# Patient Record
Sex: Male | Born: 1970 | Race: White | Hispanic: No | Marital: Married | Smoking: Never smoker
Health system: Southern US, Community
[De-identification: ages and names within clinical notes are randomized; demographics above are authoritative.]

## PROBLEM LIST (undated history)

## (undated) DIAGNOSIS — J302 Other seasonal allergic rhinitis: Secondary | ICD-10-CM

---

## 2008-11-07 ENCOUNTER — Emergency Department (HOSPITAL_BASED_OUTPATIENT_CLINIC_OR_DEPARTMENT_OTHER): Admission: EM | Admit: 2008-11-07 | Discharge: 2008-11-07 | Payer: Self-pay | Admitting: Emergency Medicine

## 2008-11-07 ENCOUNTER — Ambulatory Visit: Payer: Self-pay | Admitting: Diagnostic Radiology

## 2010-05-11 LAB — URINALYSIS, ROUTINE W REFLEX MICROSCOPIC
Glucose, UA: NEGATIVE mg/dL
Hgb urine dipstick: NEGATIVE
Nitrite: NEGATIVE
Specific Gravity, Urine: 1.017 (ref 1.005–1.030)
Urobilinogen, UA: 1 mg/dL (ref 0.0–1.0)

## 2010-11-17 IMAGING — CR DG CHEST 2V
2 series · 2 of 2 positions shown · non-contrast
Comparison: None

CLINICAL DATA: Left chest and left upper quadrant pain, worsened
with inspiration

CHEST - 2 VIEW

[w chest pa]
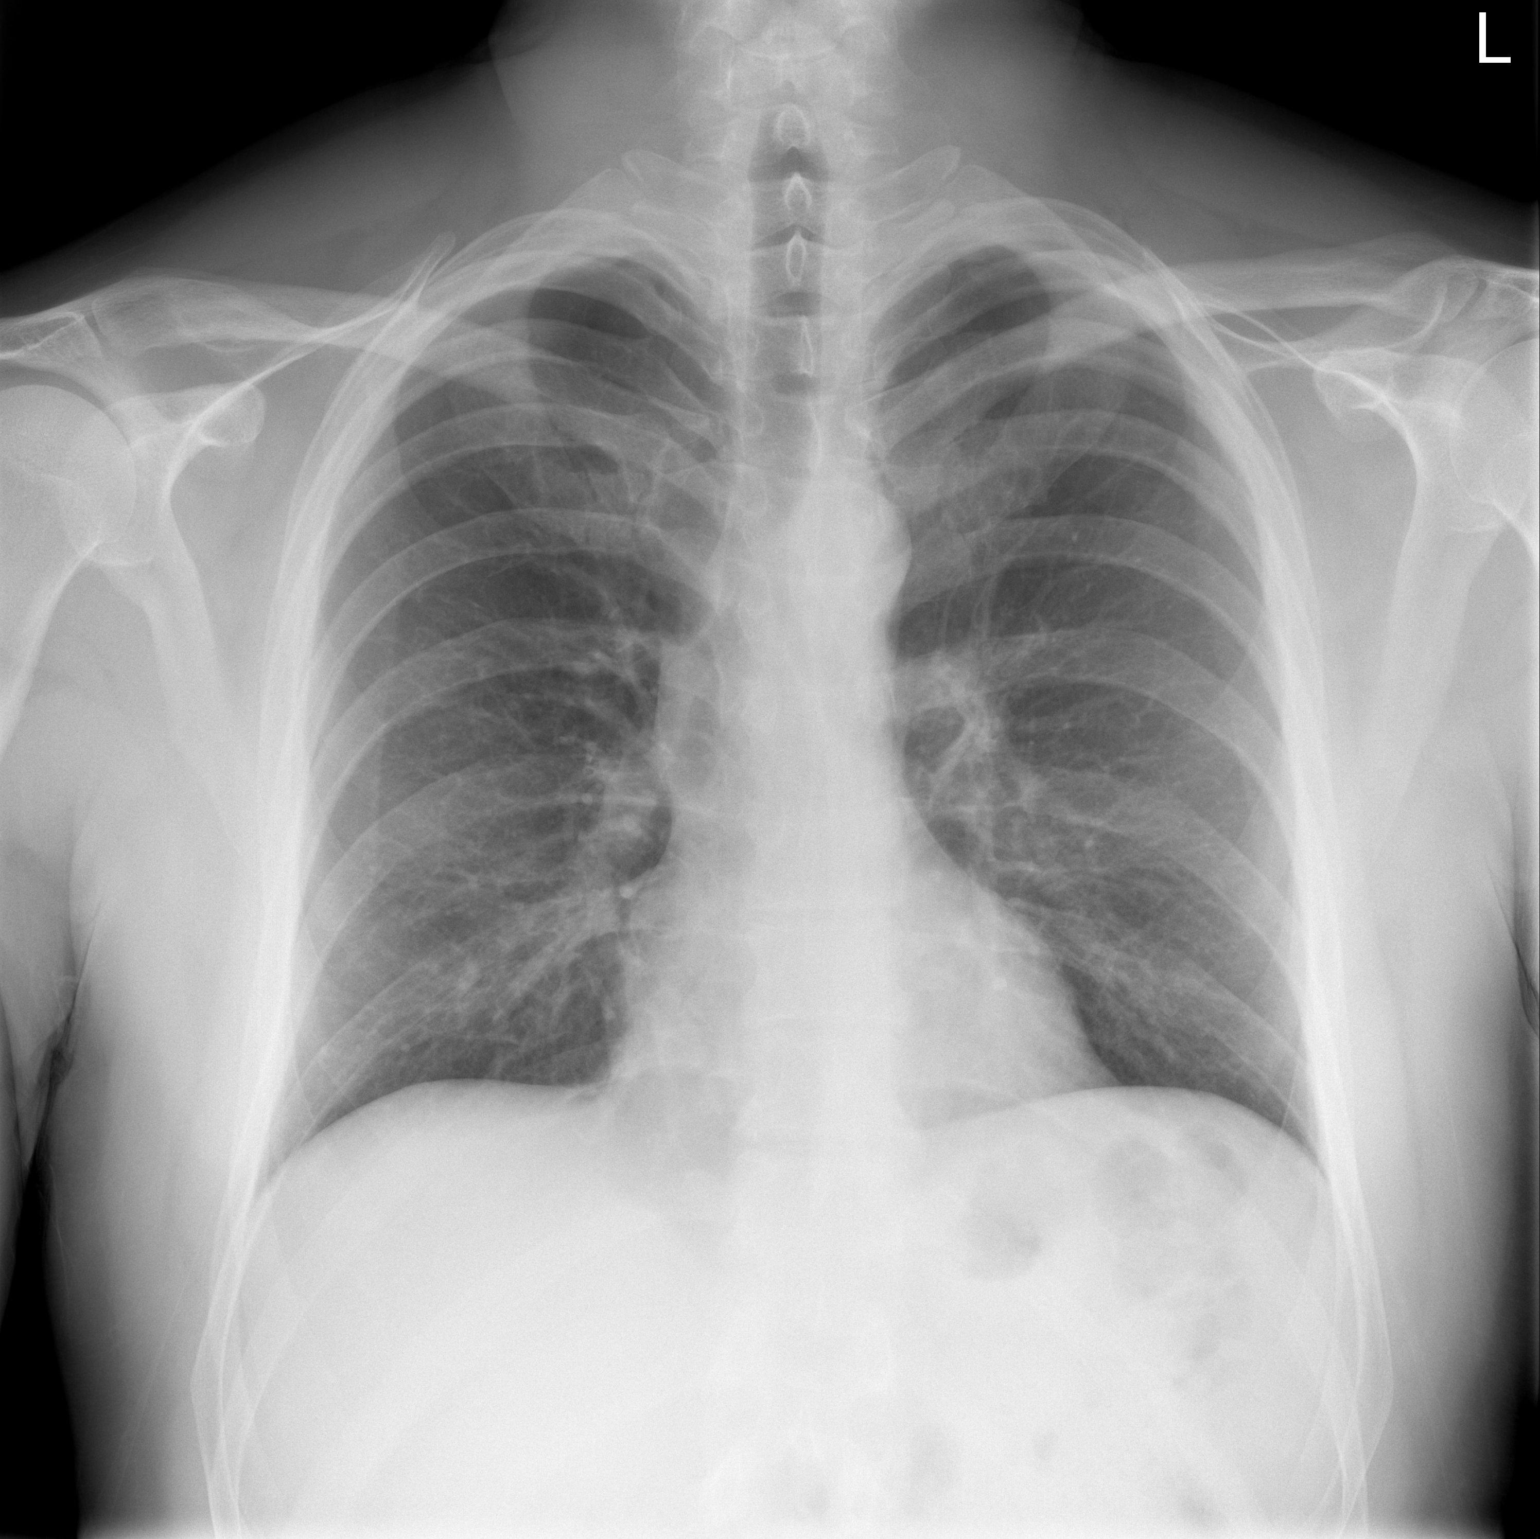

[w chest lat]
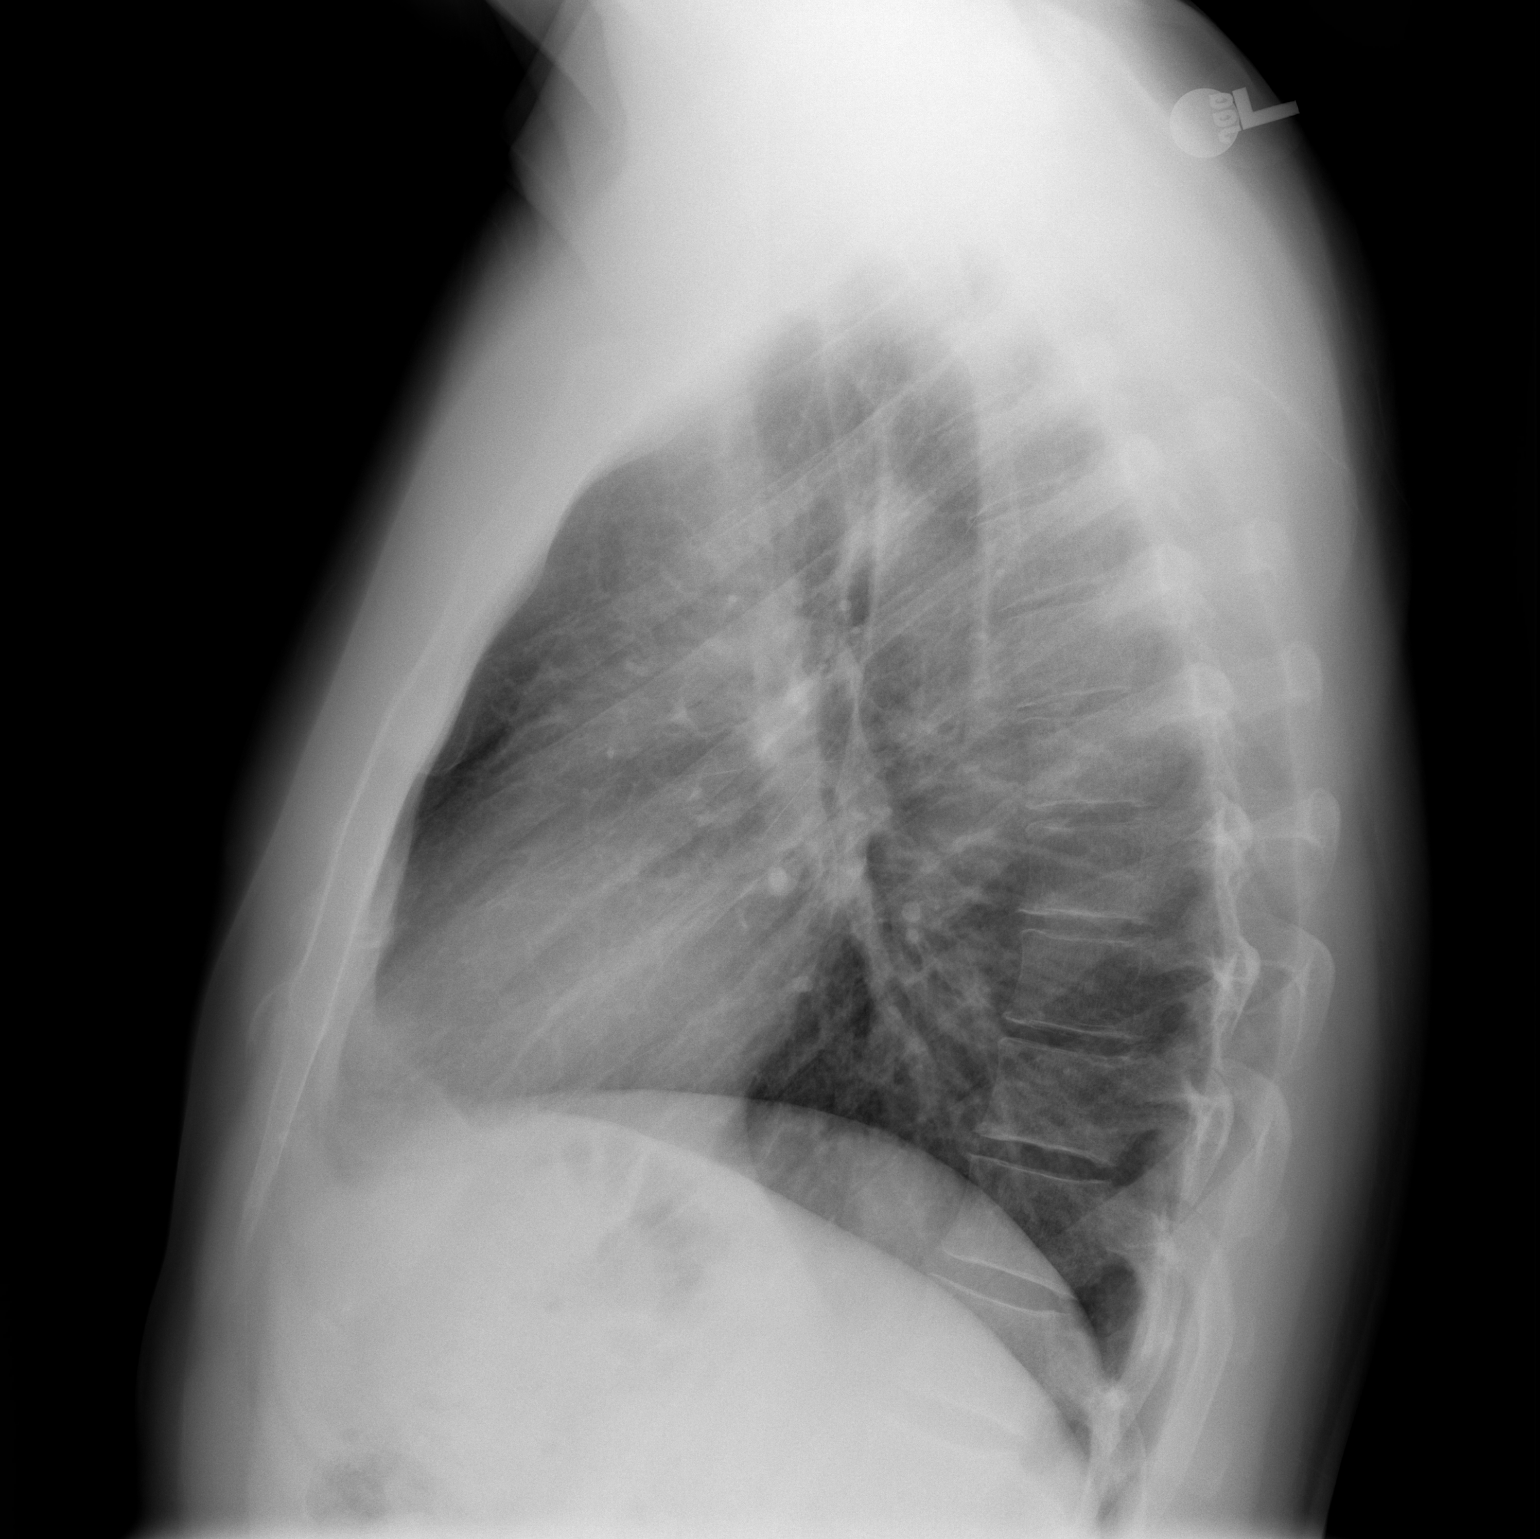

[2 of 2 positions shown; findings below may reference images not displayed]

FINDINGS: The lungs are clear.  No pneumothorax is seen and no
effusion is noted.  The heart is within normal limits in size.  No
abnormality of the ribs is seen.
IMPRESSION: No active lung disease.

## 2020-06-26 ENCOUNTER — Emergency Department (HOSPITAL_BASED_OUTPATIENT_CLINIC_OR_DEPARTMENT_OTHER)
Admission: EM | Admit: 2020-06-26 | Discharge: 2020-06-26 | Disposition: A | Discharge status: Home / Self Care | Payer: Managed Care, Other (non HMO) | Attending: Emergency Medicine | Admitting: Emergency Medicine

## 2020-06-26 ENCOUNTER — Encounter (HOSPITAL_BASED_OUTPATIENT_CLINIC_OR_DEPARTMENT_OTHER): Payer: Self-pay | Admitting: Emergency Medicine

## 2020-06-26 ENCOUNTER — Other Ambulatory Visit: Payer: Self-pay

## 2020-06-26 DIAGNOSIS — W271XXA Contact with garden tool, initial encounter: Secondary | ICD-10-CM | POA: Diagnosis not present

## 2020-06-26 DIAGNOSIS — S81812A Laceration without foreign body, left lower leg, initial encounter: Secondary | ICD-10-CM | POA: Insufficient documentation

## 2020-06-26 DIAGNOSIS — Z23 Encounter for immunization: Secondary | ICD-10-CM | POA: Insufficient documentation

## 2020-06-26 DIAGNOSIS — S8992XA Unspecified injury of left lower leg, initial encounter: Secondary | ICD-10-CM | POA: Diagnosis present

## 2020-06-26 HISTORY — DX: Other seasonal allergic rhinitis: J30.2

## 2020-06-26 MED ORDER — TETANUS-DIPHTH-ACELL PERTUSSIS 5-2.5-18.5 LF-MCG/0.5 IM SUSY
0.5000 mL | PREFILLED_SYRINGE | Freq: Once | INTRAMUSCULAR | Status: AC
  Administered 2020-06-26: 0.5 mL via INTRAMUSCULAR
  Filled 2020-06-26: qty 0.5

## 2020-06-26 MED ORDER — CEPHALEXIN 250 MG PO CAPS: 250.0000 mg | ORAL_CAPSULE | Freq: Three times a day (TID) | ORAL | 0 refills | Status: AC

## 2020-06-26 NOTE — Discharge Instructions (Signed)
Take antibiotics to help prevent infection. Your tetanus shot was updated today. The glue should fall off on its own after several days. You may get the area wet, but not scrub vigorously at it, or it will fall off sooner. Follow-up with your primary care doctor as needed for recheck of your symptoms. Return to the emergency room if develop high fevers, severe worsening pain, redness streaking up or down your leg, or any new, worsening, or concerning symptoms

## 2020-06-26 NOTE — ED Notes (Signed)
ED Provider at bedside. 

## 2020-06-26 NOTE — ED Triage Notes (Signed)
Reports tripping over the lawnmower yesterday cutting the left lower leg.

## 2020-06-26 NOTE — ED Provider Notes (Signed)
[facility] EMERGENCY DEPARTMENT Provider Note   CSN: [id number] Arrival date & time: 06/26/20  1855     History Chief Complaint  Patient presents with  . Extremity Laceration    Jeremy Morrow is a 50 y.o. male presenting for evaluation of chin laceration.  Patient states yesterday he tripped over the lawnmower, and cut his left leg.  He had acute onset pain and bleeding.  He applied a butterfly bandage, but today continued to have bleeding and was encouraged by his family to be evaluated.  He is not immunosuppression or blood thinners.  He used ibuprofen and ice last night with improvement of symptoms, has not taken anything today.  He reports no pain today.  No fevers or chills.  He does not know when his last tetanus shot was  HPI     Past Medical History:  Diagnosis Date  . Seasonal allergies     There are no problems to display for this patient.   History reviewed. No pertinent surgical history.     No family history on file.  Social History   Tobacco Use  . Smoking status: Never Smoker  . Smokeless tobacco: Never Used  Substance Use Topics  . Alcohol use: Yes    Comment: rarely  . Drug use: Never    Home Medications Prior to Admission medications   Medication Sig Start Date End Date Taking? Authorizing Provider  cephALEXin (KEFLEX) 250 MG capsule Take 1 capsule (250 mg total) by mouth 3 (three) times daily for 3 days. 06/26/20 06/29/20 Yes Tunya Held, PA-C    Allergies    Patient has no allergy information on record.  Review of Systems   Review of Systems  Skin: Positive for wound.  Hematological: Does not bruise/bleed easily.    Physical Exam Updated Vital Signs BP (!) 153/98 (BP Location: Right Arm)   Temp 98.2 F (36.8 C) (Oral)   Resp 18   Ht 6' (1.829 m)   Wt 124.7 kg   SpO2 100%   BMI 37.30 kg/m   Physical Exam Vitals and nursing note reviewed.  Constitutional:      General: He is not in acute distress.     Appearance: He is well-developed.  HENT:     Head: Normocephalic and atraumatic.  Pulmonary:     Effort: Pulmonary effort is normal.  Abdominal:     General: There is no distension.  Musculoskeletal:        General: Normal range of motion.     Cervical back: Normal range of motion.  Skin:    General: Skin is warm.     Capillary Refill: Capillary refill takes less than 2 seconds.     Findings: No rash.     Comments: 1 cm laceration of the anterior left shin with underlying hematoma.  No active bleeding.  Full active range of motion of the knee and ankle without difficulty or pain.  Pedal pulses 2+.  Neurological:     Mental Status: He is alert and oriented to person, place, and time.          ED Results / Procedures / Treatments   Labs (all labs ordered are listed, but only abnormal results are displayed) Labs Reviewed - No data to display  EKG None  Radiology No results found.  Procedures .Marland KitchenLaceration Repair  Date/Time: 06/26/2020 7:36 PM Performed by: Alveria Apley, PA-C Authorized by: Alveria Apley, PA-C   Consent:    Consent obtained:  Verbal   Consent  given by:  Patient   Risks discussed:  Need for additional repair, infection, nerve damage, pain, poor cosmetic result and poor wound healing Laceration details:    Location:  Leg   Leg location:  L lower leg   Length (cm):  1 Treatment:    Area cleansed with:  Saline   Amount of cleaning:  Standard Skin repair:    Repair method:  Tissue adhesive Approximation:    Approximation:  Close Repair type:    Repair type:  Simple Post-procedure details:    Dressing:  Open (no dressing)   Procedure completion:  Tolerated well, no immediate complications     Medications Ordered in ED Medications  Tdap (BOOSTRIX) injection 0.5 mL (has no administration in time range)    ED Course  I have reviewed the triage vital signs and the nursing notes.  Pertinent labs & imaging results that were available  during my care of the patient were reviewed by me and considered in my medical decision making (see chart for details).    MDM Rules/Calculators/A&P                          Patient presenting for evaluation of shin laceration.  On exam, patient appears nontoxic.  He is neurovascularly intact.  Laceration was sustained greater than 24 hours ago, there is no active bleeding and I do not believe the wound needs sutures at this time.  This may cause harm as it could increase risk for infection.  Discussed with patient.  Instead, will place Dermabond as there is some minimal oozing intermittently throughout today.  Will place patient on short course of prophylactic antibiotics and update tetanus.  At this time, patient appears safe for discharge.  Return precautions given.  Patient states he understands and agrees to plan.  Final Clinical Impression(s) / ED Diagnoses Final diagnoses:  Laceration of left lower leg, initial encounter    Rx / DC Orders ED Discharge Orders         Ordered    cephALEXin (KEFLEX) 250 MG capsule  3 times daily        06/26/20 1939           Alveria Apley, PA-C 06/26/20 1940    Melene Plan, DO 06/26/20 2228

## 2020-06-26 NOTE — ED Notes (Signed)
ED Provider at bedside with dermabond for lac repair
